# Patient Record
Sex: Female | Born: 1935 | Race: White | Hispanic: No | Smoking: Former smoker
Health system: Southern US, Community
[De-identification: ages and names within clinical notes are randomized; demographics above are authoritative.]

## PROBLEM LIST (undated history)

## (undated) DIAGNOSIS — I10 Essential (primary) hypertension: Secondary | ICD-10-CM

## (undated) HISTORY — PX: TONSILLECTOMY: SUR1361

## (undated) HISTORY — PX: APPENDECTOMY: SHX54

---

## 2008-06-19 ENCOUNTER — Encounter: Admission: RE | Admit: 2008-06-19 | Discharge: 2008-06-19 | Payer: Self-pay | Admitting: Unknown Physician Specialty

## 2011-05-22 ENCOUNTER — Other Ambulatory Visit: Payer: Self-pay | Admitting: Unknown Physician Specialty

## 2011-05-22 ENCOUNTER — Ambulatory Visit
Admission: RE | Admit: 2011-05-22 | Discharge: 2011-05-22 | Disposition: A | Discharge status: Home / Self Care | Payer: Medicare Other | Source: Ambulatory Visit | Attending: Unknown Physician Specialty | Admitting: Unknown Physician Specialty

## 2011-05-22 DIAGNOSIS — R609 Edema, unspecified: Secondary | ICD-10-CM

## 2011-05-22 DIAGNOSIS — R52 Pain, unspecified: Secondary | ICD-10-CM

## 2013-08-09 ENCOUNTER — Encounter: Payer: Self-pay | Admitting: Emergency Medicine

## 2013-08-09 ENCOUNTER — Emergency Department (INDEPENDENT_AMBULATORY_CARE_PROVIDER_SITE_OTHER): Payer: Medicare Other

## 2013-08-09 ENCOUNTER — Emergency Department (INDEPENDENT_AMBULATORY_CARE_PROVIDER_SITE_OTHER)
Admission: EM | Admit: 2013-08-09 | Discharge: 2013-08-09 | Disposition: A | Discharge status: Home / Self Care | Payer: Medicare Other | Source: Home / Self Care | Attending: Family Medicine | Admitting: Family Medicine

## 2013-08-09 DIAGNOSIS — R918 Other nonspecific abnormal finding of lung field: Secondary | ICD-10-CM

## 2013-08-09 DIAGNOSIS — I10 Essential (primary) hypertension: Secondary | ICD-10-CM | POA: Insufficient documentation

## 2013-08-09 DIAGNOSIS — J069 Acute upper respiratory infection, unspecified: Secondary | ICD-10-CM

## 2013-08-09 HISTORY — DX: Essential (primary) hypertension: I10

## 2013-08-09 MED ORDER — DOXYCYCLINE HYCLATE 100 MG PO CAPS: 100.0000 mg | ORAL_CAPSULE | Freq: Two times a day (BID) | ORAL | Status: AC

## 2013-08-09 NOTE — ED Provider Notes (Signed)
CSN: [id number]     Arrival date & time 08/09/13  1211 History   First MD Initiated Contact with Patient 08/09/13 1213     No chief complaint on file.   HPI  URI Symptoms Onset: > 1 week  Description: cough, nasal congestion, intermittent dyspnea with coughing  Modifying factors:  none  Symptoms Nasal discharge: yes Fever: n Sore throat: no Cough: yes Wheezing: no Ear pain: no GI symptoms: no Sick contacts: no  Red Flags  Stiff neck: no Dyspnea: mild Rash: no Swallowing difficulty: no  Sinusitis Risk Factors Headache/face pain: no Double sickening: no tooth pain: no  Allergy Risk Factors Sneezing: no Itchy scratchy throat: no Seasonal symptoms: no  Flu Risk Factors Headache: no muscle aches: no severe fatigue: no   No past medical history on file. No past surgical history on file. No family history on file. History  Substance Use Topics  . Smoking status: Not on file  . Smokeless tobacco: Not on file  . Alcohol Use: Not on file   OB History   No data available     Review of Systems  All other systems reviewed and are negative.   Allergies  Review of patient's allergies indicates not on file.  Home Medications   Prior to Admission medications   Not on File   There were no vitals taken for this visit. Physical Exam  Constitutional: She appears well-developed and well-nourished.  HENT:  Head: Normocephalic and atraumatic.  Right Ear: External ear normal.  Left Ear: External ear normal.  Eyes: Conjunctivae are normal. Pupils are equal, round, and reactive to light.  Neck: Normal range of motion. Neck supple.  Cardiovascular: Normal rate and regular rhythm.   Pulmonary/Chest: Effort normal.  Faint rales in lower lobes    Abdominal: Soft. Bowel sounds are normal.  Musculoskeletal: Normal range of motion.  Neurological: She is alert.  Skin: Skin is warm.    ED Course  Procedures (including critical care time) Labs Review Labs  Reviewed - No data to display  Imaging Review Dg Chest 2 View  08/09/2013   CLINICAL DATA:  Cough.  EXAM: CHEST  2 VIEW  COMPARISON:  None.  FINDINGS: Abnormal contour along the left aspect of the mediastinum. Normal heart size. Calcification of the transverse thoracic aorta. Bilateral lower lung linear opacities. No large consolidative pulmonary opacity. No pleural effusion or pneumothorax. Mid thoracic spine degenerative change.  IMPRESSION: 1. Abnormal contour abnormality along the left aspect of the mediastinum, nonspecific, but may be secondary to tortuosity of the thoracic aorta. Focal aneurysmal dilatation of the aorta or potentially mass lesion is not excluded. Recommend dedicated evaluation with chest CT. 2. Bibasilar atelectasis and or scarring.   Electronically Signed   By: Annia Beltrew  Davis M.D.   On: 08/09/2013 13:32     MDM   1. URI (upper respiratory infection)    Suspect viral process  CXR negative for PNA. Noted abnormal contour along L mediastinum. Pt states that this has been evaluated with CT scan w/in the past 2 years that was WNL. Declining imaging today  Will place on doxy for lower resp coverage Discussed infectious and resp red flags at length Follow up as needed.     The patient and/or caregiver has been counseled thoroughly with regard to treatment plan and/or medications prescribed including dosage, schedule, interactions, rationale for use, and possible side effects and they verbalize understanding. Diagnoses and expected course of recovery discussed and will return if not improved as  expected or if the condition worsens. Patient and/or caregiver verbalized understanding.          Doree AlbeeSteven Jahanna Raether, MD 08/09/13 1344

## 2013-08-09 NOTE — ED Notes (Signed)
Cassidy MccreedyBarbara complains of headaches, sore throat, runny nose, sneezing, congestion, cough and hoarseness. Denies fever, chills or sweats.

## 2014-03-12 ENCOUNTER — Ambulatory Visit (INDEPENDENT_AMBULATORY_CARE_PROVIDER_SITE_OTHER): Payer: Medicare Other

## 2014-03-12 ENCOUNTER — Other Ambulatory Visit: Payer: Self-pay | Admitting: Unknown Physician Specialty

## 2014-03-12 DIAGNOSIS — K5900 Constipation, unspecified: Secondary | ICD-10-CM

## 2014-03-12 DIAGNOSIS — R109 Unspecified abdominal pain: Secondary | ICD-10-CM

## 2014-03-12 DIAGNOSIS — J90 Pleural effusion, not elsewhere classified: Secondary | ICD-10-CM

## 2014-03-30 ENCOUNTER — Other Ambulatory Visit: Payer: Self-pay | Admitting: Unknown Physician Specialty

## 2014-03-30 ENCOUNTER — Ambulatory Visit (INDEPENDENT_AMBULATORY_CARE_PROVIDER_SITE_OTHER): Payer: Medicare Other

## 2014-03-30 DIAGNOSIS — R0602 Shortness of breath: Secondary | ICD-10-CM

## 2014-03-30 DIAGNOSIS — M4134 Thoracogenic scoliosis, thoracic region: Secondary | ICD-10-CM

## 2014-03-30 DIAGNOSIS — J90 Pleural effusion, not elsewhere classified: Secondary | ICD-10-CM

## 2014-04-27 DEATH — deceased

## 2016-02-28 IMAGING — CR DG CHEST 2V
2 series · 2 of 2 positions shown · non-contrast
Comparison: 08/09/2013

CLINICAL DATA: Shortness of breath, possible pneumonia

EXAM:
CHEST  2 VIEW

[view not recorded (1 of 2)]
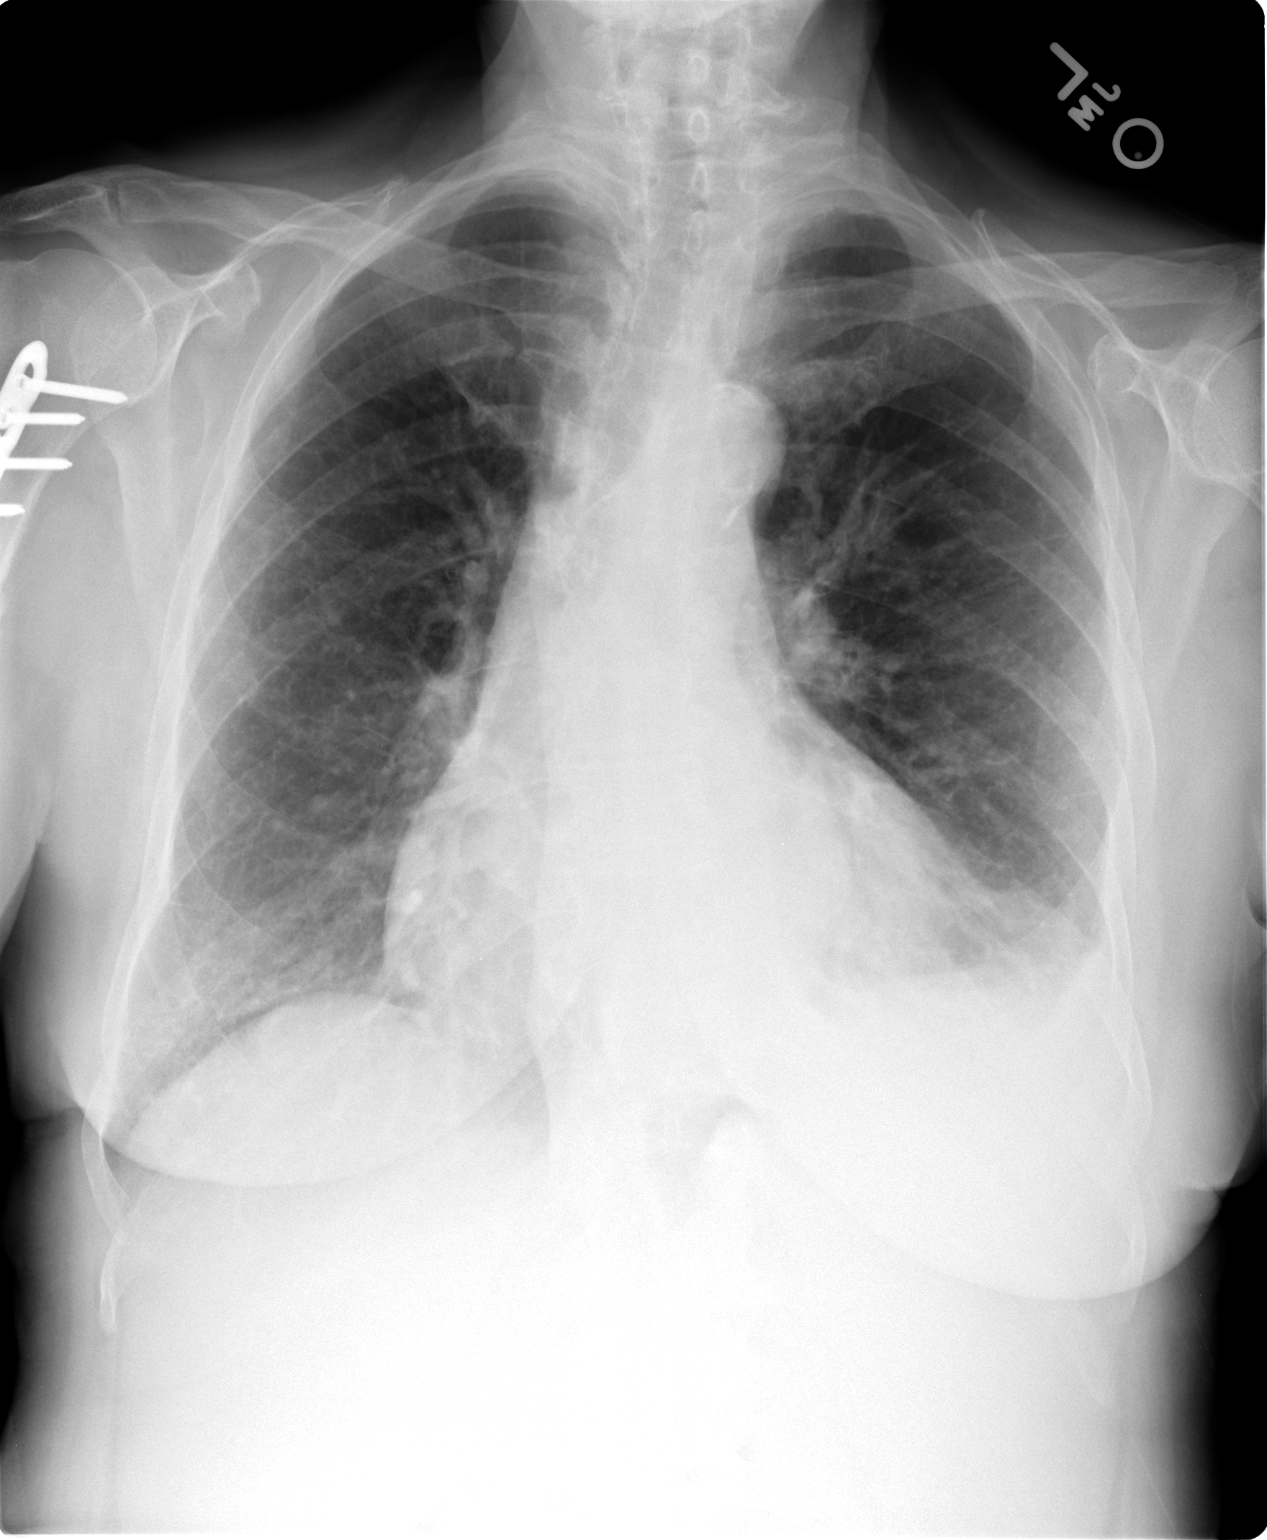

[view not recorded (2 of 2)]
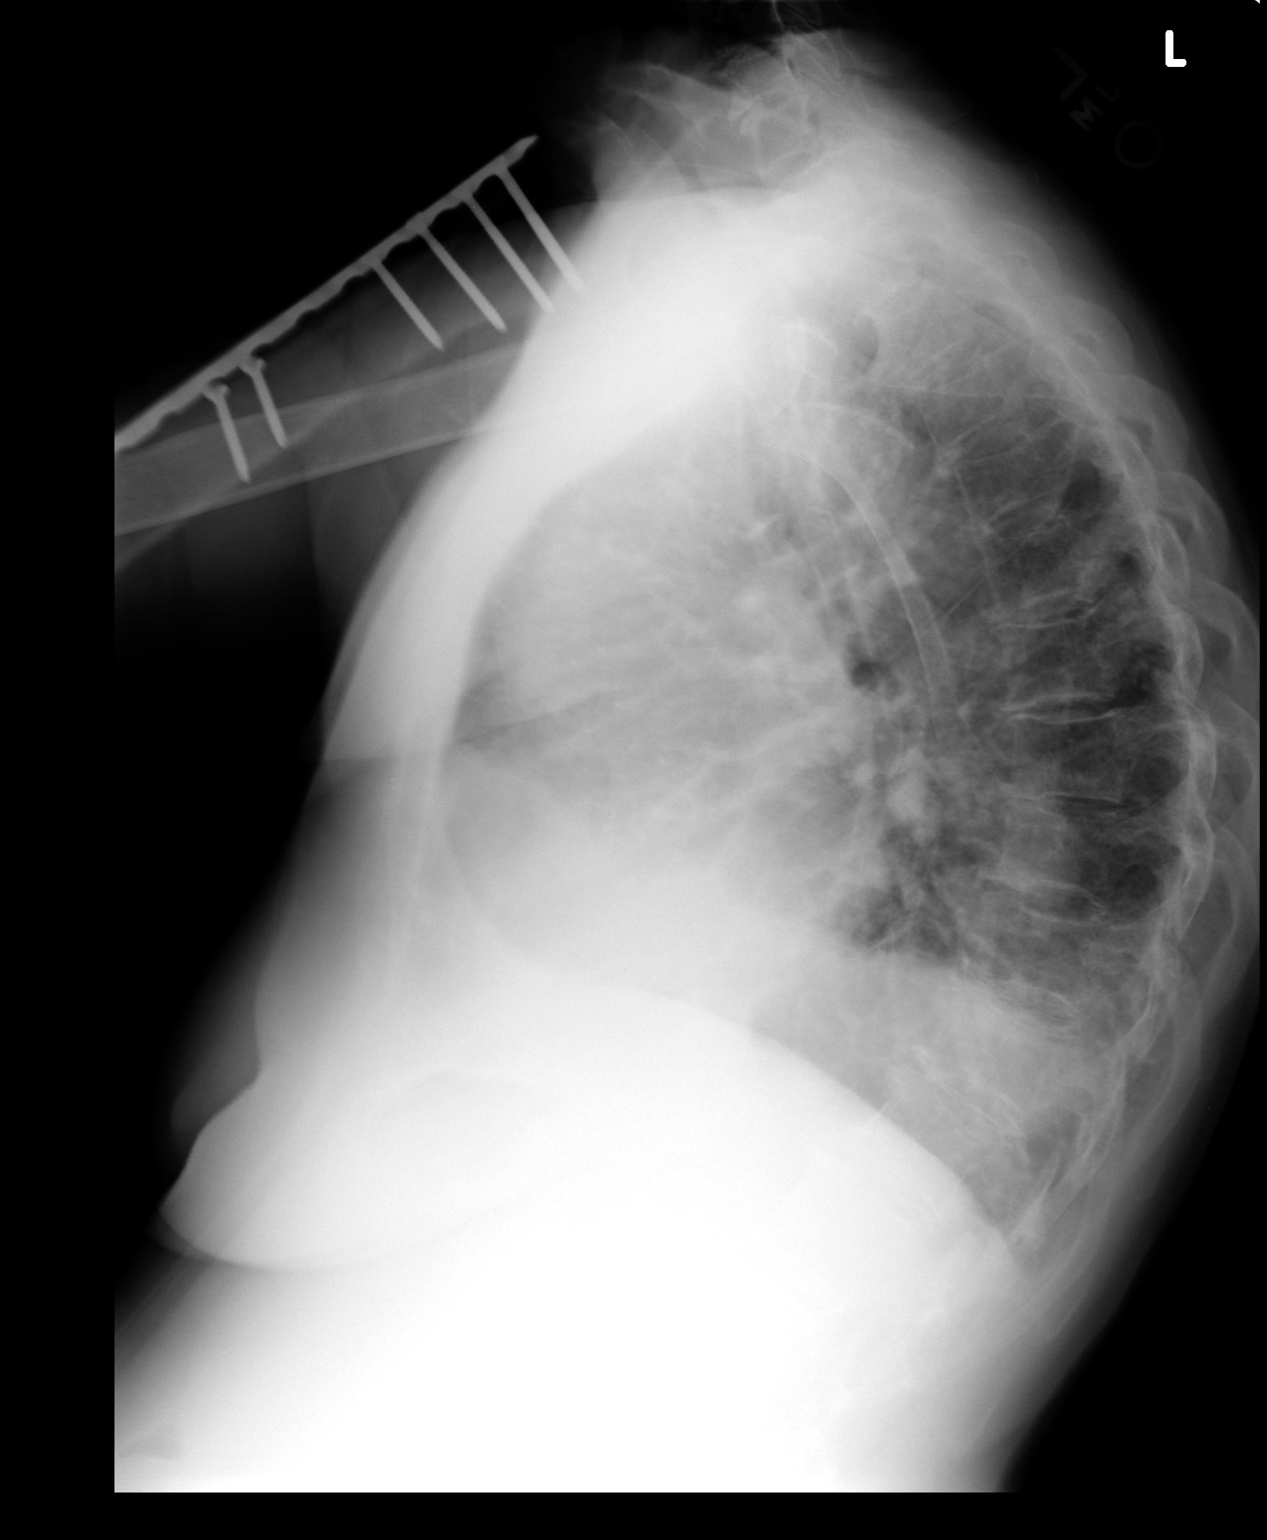

[2 of 2 positions shown; findings below may reference images not displayed]

FINDINGS: Cardiomediastinal silhouette is stable. Mild mid thoracic
dextroscoliosis. There is small left pleural effusion with with left
basilar atelectasis or infiltrate. No pulmonary edema.
IMPRESSION: Small left pleural effusion with left basilar atelectasis or
infiltrate. No pulmonary edema. Mild mid thoracic dextroscoliosis.
